# Patient Record
Sex: Male | Born: 1955 | Race: White | Hispanic: No | Marital: Married
Health system: Southern US, Community
[De-identification: ages and names within clinical notes are randomized; demographics above are authoritative.]

---

## 2005-07-23 ENCOUNTER — Inpatient Hospital Stay: Payer: Self-pay | Admitting: Internal Medicine

## 2012-12-15 ENCOUNTER — Ambulatory Visit: Payer: Self-pay | Admitting: Orthopedic Surgery

## 2014-07-03 ENCOUNTER — Emergency Department: Payer: Self-pay | Admitting: Student

## 2015-12-26 IMAGING — CR DG CHEST 2V
1 series · 2 of 2 positions shown · non-contrast
Comparison: None.

CLINICAL DATA: 58-year-old male with left chest pain and congestion
for 2 days. Initial encounter.

EXAM:
CHEST  2 VIEW

[Series 1: w chest pa · 0.14mm/px · 2 of 2 slices shown]
[im 1/2]
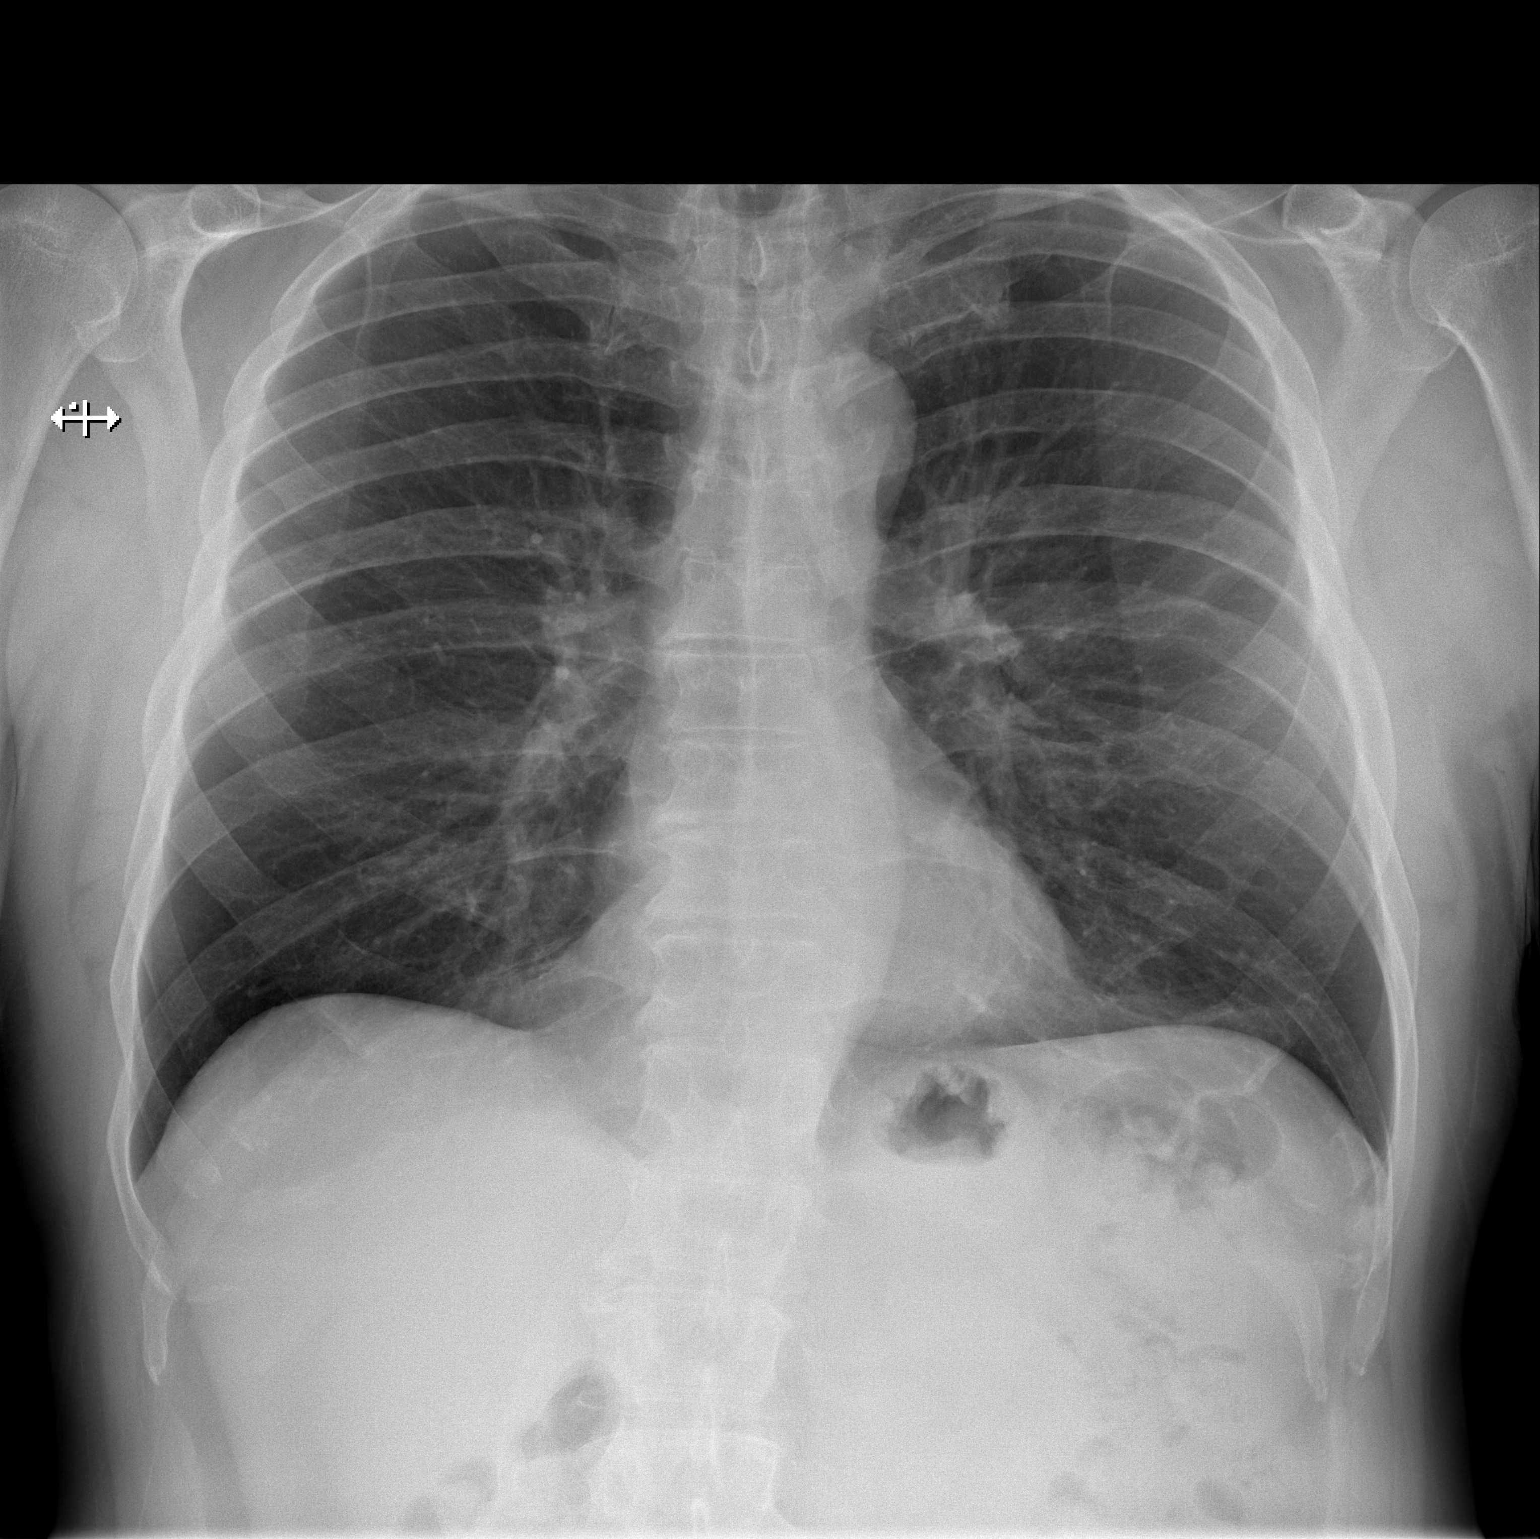
[im 2/2]
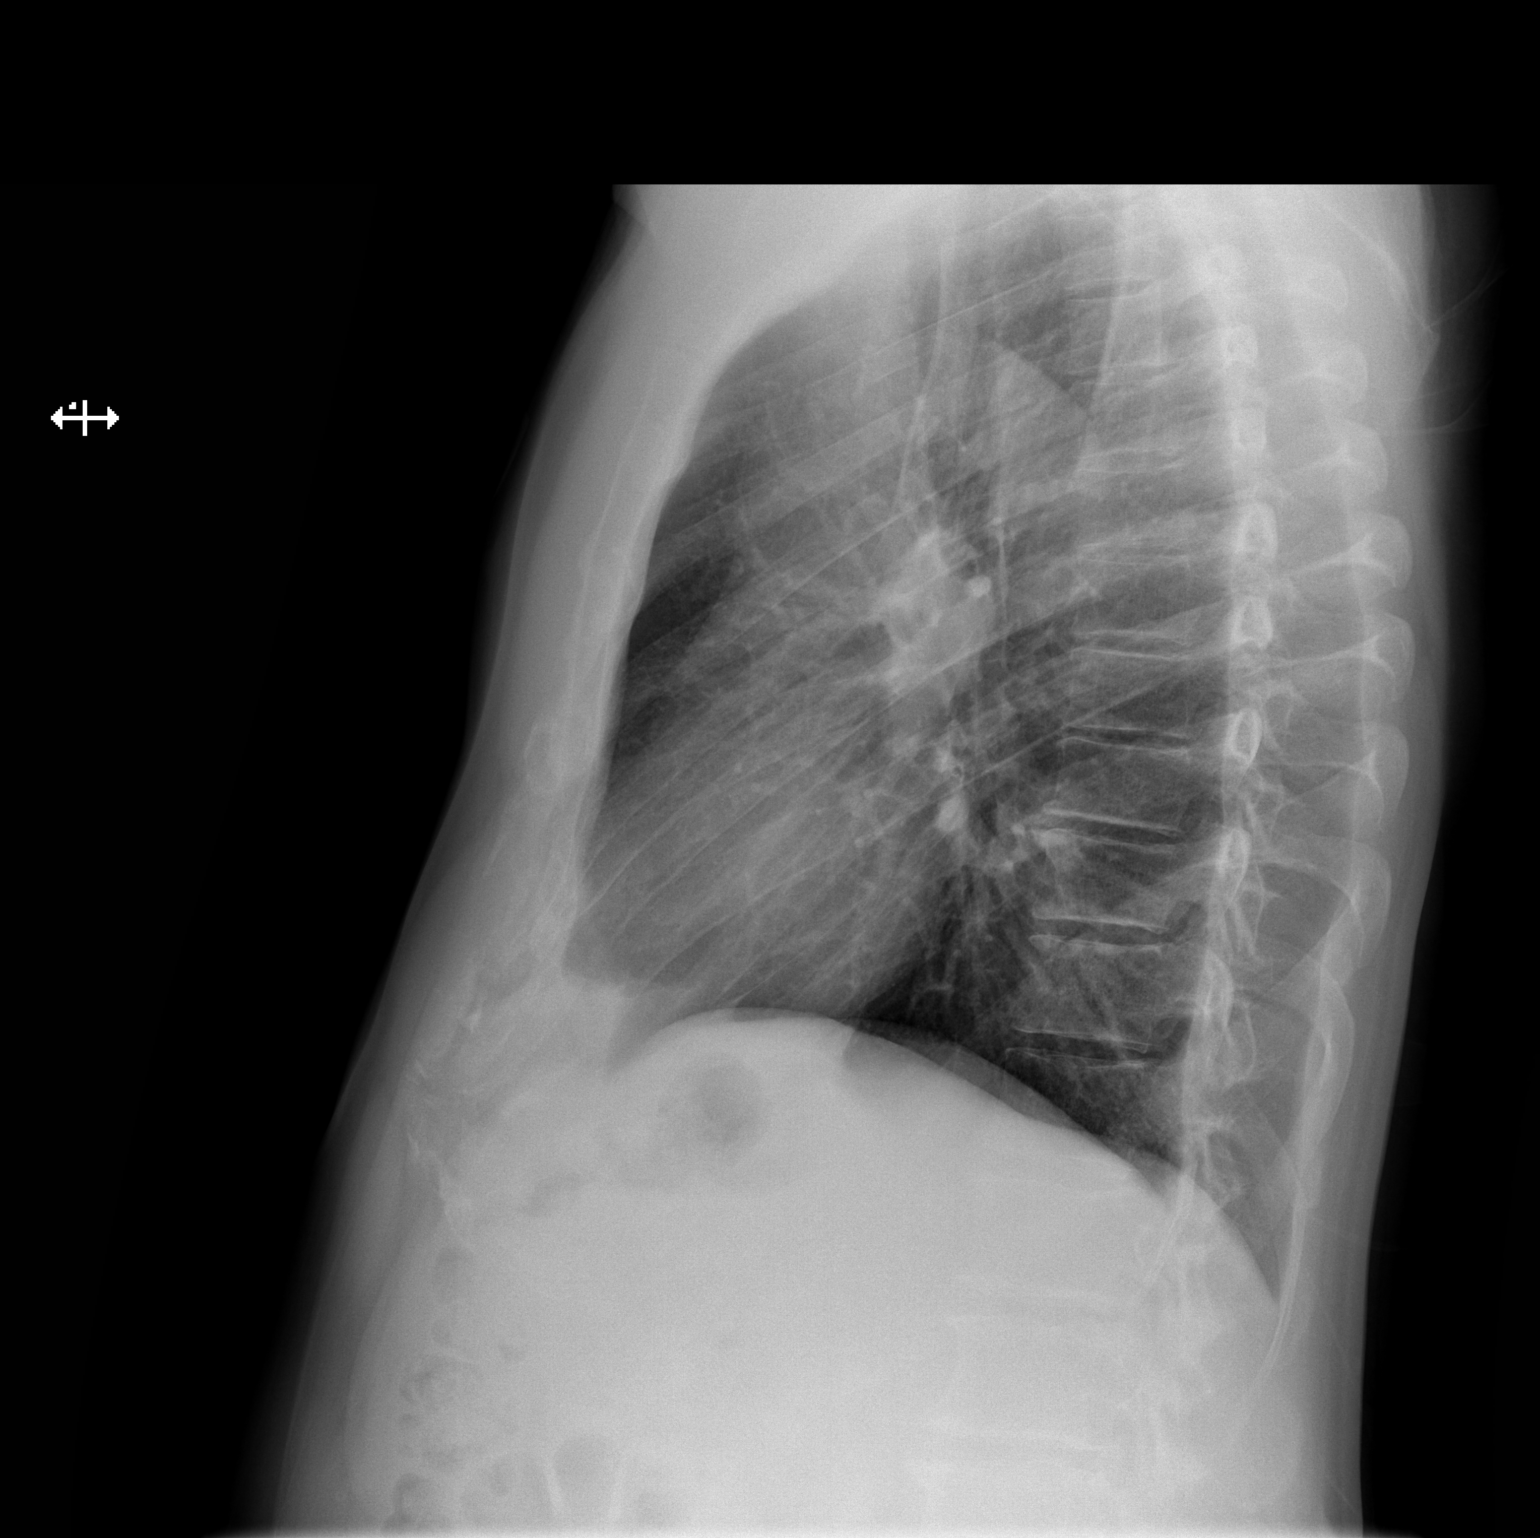

[2 of 2 positions shown; findings below may reference images not displayed]

FINDINGS: Lung volumes at the upper limits of normal. Normal cardiac size and
mediastinal contours. Visualized tracheal air column is within
normal limits. No pneumothorax, pulmonary edema, pleural effusion or
confluent pulmonary opacity. No acute osseous abnormality
identified.
IMPRESSION: Negative, no acute cardiopulmonary abnormality.

## 2019-12-07 ENCOUNTER — Ambulatory Visit: Payer: BC Managed Care – PPO | Attending: Internal Medicine

## 2019-12-07 DIAGNOSIS — Z23 Encounter for immunization: Secondary | ICD-10-CM

## 2019-12-07 NOTE — Progress Notes (Signed)
   Covid-19 Vaccination Clinic  Name:  Alex Morgan    MRN: [id number] DOB: 1956/04/08  12/07/2019  Mr. Alex Morgan was observed post Covid-19 immunization for 15 minutes without incident. He was provided with Vaccine Information Sheet and instruction to access the V-Safe system.   Mr. Alex Morgan was instructed to call 911 with any severe reactions post vaccine: Marland Kitchen Difficulty breathing  . Swelling of face and throat  . A fast heartbeat  . A bad rash all over body  . Dizziness and weakness   Immunizations Administered    Name Date Dose VIS Date Route   Pfizer COVID-19 Vaccine 12/07/2019  9:52 AM 0.3 mL 07/04/2018 Intramuscular   Manufacturer: [employer], [employer]   Lot: [id number]   NDC: [address]-0

## 2019-12-31 ENCOUNTER — Ambulatory Visit: Payer: BC Managed Care – PPO | Attending: Internal Medicine

## 2019-12-31 ENCOUNTER — Ambulatory Visit: Payer: BC Managed Care – PPO

## 2019-12-31 DIAGNOSIS — Z23 Encounter for immunization: Secondary | ICD-10-CM

## 2019-12-31 NOTE — Progress Notes (Signed)
   Covid-19 Vaccination Clinic  Name:  Alex Morgan    MRN: [id number] DOB: 1955-10-08  12/31/2019  Mr. Alex Morgan was observed post Covid-19 immunization for 15 minutes without incident. He was provided with Vaccine Information Sheet and instruction to access the V-Safe system.   Mr. Alex Morgan was instructed to call 911 with any severe reactions post vaccine: Marland Kitchen Difficulty breathing  . Swelling of face and throat  . A fast heartbeat  . A bad rash all over body  . Dizziness and weakness   Immunizations Administered    Name Date Dose VIS Date Route   Pfizer COVID-19 Vaccine 12/31/2019  2:30 PM 0.3 mL 07/04/2018 Intramuscular   Manufacturer: [employer], [employer]   Lot: [id number]   NDC: [address]-9

## 2021-04-23 ENCOUNTER — Ambulatory Visit (INDEPENDENT_AMBULATORY_CARE_PROVIDER_SITE_OTHER): Payer: Medicare Other | Admitting: Dermatology

## 2021-04-23 ENCOUNTER — Other Ambulatory Visit: Payer: Self-pay

## 2021-04-23 DIAGNOSIS — L720 Epidermal cyst: Secondary | ICD-10-CM | POA: Diagnosis not present

## 2021-04-23 DIAGNOSIS — L723 Sebaceous cyst: Secondary | ICD-10-CM

## 2021-04-23 MED ORDER — DOXYCYCLINE MONOHYDRATE 100 MG PO TABS: 100.0000 mg | ORAL_TABLET | Freq: Two times a day (BID) | ORAL | 0 refills | Status: AC

## 2021-04-23 NOTE — Progress Notes (Signed)
° °  Follow-Up Visit   Subjective  Alex Morgan is a 65 y.o. male who presents for the following: Inflamed cyst (Of the R post neck - painful, tender to the touch, difficult to turn neck. Patient would like to discuss treatment options.).  The following portions of the chart were reviewed this encounter and updated as appropriate:   Allergies   Meds   Problems   Med Hx   Surg Hx   Fam Hx       Review of Systems:  No other skin or systemic complaints except as noted in HPI or Assessment and Plan.  Objective  Well appearing patient in no apparent distress; mood and affect are within normal limits.  A focused examination was performed including the neck. Relevant physical exam findings are noted in the Assessment and Plan.  R post neck 1.2 cm inflamed cystic nodule.     Assessment & Plan  Inflamed epidermoid cyst of skin R post neck  Start Doxycycline 100mg  po BID x 1 week. Doxycycline should be taken with food to prevent nausea. Do not lay down for 30 minutes after taking. Be cautious with sun exposure and use good sun protection while on this medication. Pregnant women should not take this medication.   Incision and Drainage - R post neck Location: R post neck  Informed Consent: Discussed risks (permanent scarring, light or dark discoloration, infection, pain, bleeding, bruising, redness, damage to adjacent structures, and recurrence of the lesion) and benefits of the procedure, as well as the alternatives.  Informed consent was obtained.  Preparation: The area was prepped with alcohol.  Anesthesia: Lidocaine 1% with epinephrine  Procedure Details: An incision was made overlying the lesion. The lesion drained pus and blood. .  A small amount of fluid was drained.  Curettage performed. Saline flushed into incision site. Antibiotic ointment and a sterile pressure dressing were applied. The patient tolerated procedure well.  Total number of lesions drained: 1  Plan: The patient  was instructed on post-op care. Recommend OTC analgesia as needed for pain.   doxycycline (ADOXA) 100 MG tablet - R post neck Take 1 tablet (100 mg total) by mouth 2 (two) times daily. Take with food and plenty of drink.  Anaerobic and Aerobic Culture - R post neck   Return if symptoms worsen or fail to improve.  , CMA, am acting as scribe for Maylene Roes, MD .  Documentation: I have reviewed the above documentation for accuracy and completeness, and I agree with the above.  Darden Dates, MD

## 2021-04-23 NOTE — Patient Instructions (Addendum)
Doxycycline should be taken with food to prevent nausea. Do not lay down for 30 minutes after taking. Be cautious with sun exposure and use good sun protection while on this medication. Pregnant women should not take this medication.      If You Need Anything After Your Visit  If you have any questions or concerns for your doctor, please call our main line at [phone] and press option 4 to reach your doctor's medical assistant. If no one answers, please leave a voicemail as directed and we will return your call as soon as possible. Messages left after 4 pm will be answered the following business day.   You may also send us a message via MyChart. We typically respond to MyChart messages within 1-2 business days.  For prescription refills, please ask your pharmacy to contact our office. Our fax number is [phone].  If you have an urgent issue when the clinic is closed that cannot wait until the next business day, you can page your doctor at the number below.    Please note that while we do our best to be available for urgent issues outside of office hours, we are not available 24/7.   If you have an urgent issue and are unable to reach us, you may choose to seek medical care at your doctor's office, retail clinic, urgent care center, or emergency room.  If you have a medical emergency, please immediately call 911 or go to the emergency department.  Pager Numbers  - Dr. Kowalski: [phone]  - Dr. Moye: [phone]  - Dr. Stewart: [phone]  In the event of inclement weather, please call our main line at [phone] for an update on the status of any delays or closures.  Dermatology Medication Tips: Please keep the boxes that topical medications come in in order to help keep track of the instructions about where and how to use these. Pharmacies typically print the medication instructions only on the boxes and not directly on the medication tubes.   If your medication is  too expensive, please contact our office at [phone] option 4 or send us a message through MyChart.   We are unable to tell what your co-pay for medications will be in advance as this is different depending on your insurance coverage. However, we may be able to find a substitute medication at lower cost or fill out paperwork to get insurance to cover a needed medication.   If a prior authorization is required to get your medication covered by your insurance company, please allow us 1-2 business days to complete this process.  Drug prices often vary depending on where the prescription is filled and some pharmacies may offer cheaper prices.  The website www.goodrx.com contains coupons for medications through different pharmacies. The prices here do not account for what the cost may be with help from insurance (it may be cheaper with your insurance), but the website can give you the price if you did not use any insurance.  - You can print the associated coupon and take it with your prescription to the pharmacy.  - You may also stop by our office during regular business hours and pick up a GoodRx coupon card.  - If you need your prescription sent electronically to a different pharmacy, notify our office through [facility] MyChart or by phone at [phone] option 4.     Si Usted Necesita Algo Despus de Su Visita  Tambin puede enviarnos un mensaje a travs de MyChart. Por lo   general respondemos a los mensajes de MyChart en el transcurso de 1 a 2 das hbiles.  Para renovar recetas, por favor pida a su farmacia que se ponga en contacto con nuestra oficina. Nuestro nmero de fax es el [phone].  Si tiene un asunto urgente cuando la clnica est cerrada y que no puede esperar hasta el siguiente da hbil, puede llamar/localizar a su doctor(a) al nmero que aparece a continuacin.   Por favor, tenga en cuenta que aunque hacemos todo lo posible para estar disponibles para asuntos urgentes  fuera del horario de oficina, no estamos disponibles las 24 horas del da, los 7 das de la semana.   Si tiene un problema urgente y no puede comunicarse con nosotros, puede optar por buscar atencin mdica  en el consultorio de su doctor(a), en una clnica privada, en un centro de atencin urgente o en una sala de emergencias.  Si tiene una emergencia mdica, por favor llame inmediatamente al 911 o vaya a la sala de emergencias.  Nmeros de bper  - Dr. Kowalski: [phone]  - Dra. Moye: [phone]  - Dra. Stewart: [phone]  En caso de inclemencias del tiempo, por favor llame a nuestra lnea principal al [phone] para una actualizacin sobre el estado de cualquier retraso o cierre.  Consejos para la medicacin en dermatologa: Por favor, guarde las cajas en las que vienen los medicamentos de uso tpico para ayudarle a seguir las instrucciones sobre dnde y cmo usarlos. Las farmacias generalmente imprimen las instrucciones del medicamento slo en las cajas y no directamente en los tubos del medicamento.   Si su medicamento es muy caro, por favor, pngase en contacto con nuestra oficina llamando al [phone] y presione la opcin 4 o envenos un mensaje a travs de MyChart.   No podemos decirle cul ser su copago por los medicamentos por adelantado ya que esto es diferente dependiendo de la cobertura de su seguro. Sin embargo, es posible que podamos encontrar un medicamento sustituto a menor costo o llenar un formulario para que el seguro cubra el medicamento que se considera necesario.   Si se requiere una autorizacin previa para que su compaa de seguros cubra su medicamento, por favor permtanos de 1 a 2 das hbiles para completar este proceso.  Los precios de los medicamentos varan con frecuencia dependiendo del lugar de dnde se surte la receta y alguna farmacias pueden ofrecer precios ms baratos.  El sitio web www.goodrx.com tiene cupones para medicamentos de  diferentes farmacias. Los precios aqu no tienen en cuenta lo que podra costar con la ayuda del seguro (puede ser ms barato con su seguro), pero el sitio web puede darle el precio si no utiliz ningn seguro.  - Puede imprimir el cupn correspondiente y llevarlo con su receta a la farmacia.  - Tambin puede pasar por nuestra oficina durante el horario de atencin regular y recoger una tarjeta de cupones de GoodRx.  - Si necesita que su receta se enve electrnicamente a una farmacia diferente, informe a nuestra oficina a travs de MyChart de [facility] o por telfono llamando al [phone] y presione la opcin 4.  

## 2021-04-28 ENCOUNTER — Other Ambulatory Visit: Payer: Self-pay

## 2021-04-28 ENCOUNTER — Encounter: Payer: Self-pay | Admitting: Dermatology

## 2021-04-28 ENCOUNTER — Ambulatory Visit (INDEPENDENT_AMBULATORY_CARE_PROVIDER_SITE_OTHER): Payer: Medicare Other | Admitting: Dermatology

## 2021-04-28 DIAGNOSIS — L0211 Cutaneous abscess of neck: Secondary | ICD-10-CM | POA: Diagnosis not present

## 2021-04-28 DIAGNOSIS — L0291 Cutaneous abscess, unspecified: Secondary | ICD-10-CM

## 2021-04-28 MED ORDER — DOXYCYCLINE MONOHYDRATE 100 MG PO CAPS: 100.0000 mg | ORAL_CAPSULE | Freq: Two times a day (BID) | ORAL | 0 refills | Status: AC

## 2021-04-28 NOTE — Patient Instructions (Addendum)

## 2021-04-28 NOTE — Progress Notes (Signed)
° °  Follow-Up Visit   Subjective  Alex Morgan is a 65 y.o. male who presents for the following: Inflamed cyst (R lat neck, 4 days, hx of I&D to an abscess beside this one on 04/23/21, pt currently on Doxycycline 100mg  1 po bid since 04/23/21).  The following portions of the chart were reviewed this encounter and updated as appropriate:   Allergies   Meds   Problems   Med Hx   Surg Hx   Fam Hx      Review of Systems:  No other skin or systemic complaints except as noted in HPI or Assessment and Plan.  Objective  Well appearing patient in no apparent distress; mood and affect are within normal limits.  A focused examination was performed including neck. Relevant physical exam findings are noted in the Assessment and Plan.  R lat neck Erythematous indurated tender nodule 6.0 x 3.0cm   Assessment & Plan  Abscess R lat neck  I&D today Cont Doxycycline 100mg  1 po bid with food and drink for 2 more weeks  Discussed excising once abscess resolves  Doxycycline should be taken with food to prevent nausea. Do not lay down for 30 minutes after taking. Be cautious with sun exposure and use good sun protection while on this medication. Pregnant women should not take this medication.    Incision and Drainage - R lat neck - complicated Location: R lat neck, R post neck  Informed Consent: Discussed risks (permanent scarring, light or dark discoloration, infection, pain, bleeding, bruising, redness, damage to adjacent structures, and recurrence of the lesion) and benefits of the procedure, as well as the alternatives.  Informed consent was obtained.  Preparation: The area was prepped with alcohol.  Anesthesia: Lidocaine 1% with epinephrine  Procedure Details: A 4.9mm punch was made overlying the lesion. The lesion drained pus and blood. Curettage performed. A large amount of fluid was drained.   The lesion was multiloculated.   Multiple cavities were opened and drained.    Antibiotic  ointment and a sterile pressure dressing were applied. The patient tolerated procedure well.  Total number of lesions drained: 2  Plan: The patient was instructed on post-op care. Recommend OTC analgesia as needed for pain.  Cont Doxycycline 100mg  1 po bid for 2 additional weeks, (total of 3 weeks).  doxycycline (MONODOX) 100 MG capsule - R lat neck Take 1 capsule (100 mg total) by mouth 2 (two) times daily for 14 days. Take with food and drink  Return in about 1 month (around 05/29/2021) for f/u cyst x 2 R post neck and R lat neck.  I, 1m, RMA, am acting as scribe for , MD . Documentation: I have reviewed the above documentation for accuracy and completeness, and I agree with the above.  05/31/2021, MD

## 2021-05-04 LAB — ANAEROBIC AND AEROBIC CULTURE

## 2021-05-06 ENCOUNTER — Ambulatory Visit: Payer: Medicare Other

## 2021-05-07 ENCOUNTER — Telehealth: Payer: Self-pay

## 2021-05-07 NOTE — Telephone Encounter (Signed)
-----   Message from Sandi Mealy, MD sent at 05/06/2021  5:49 PM EST ----- Culture grew proteus mirabilis bacteria resistant to tetracycline.  If he is doing well, no additional antibiotic therapy needed. If still sore or having problems, let me know and we will change to cephalexin 500 mg 3 times per day for 5 days. Thank you!

## 2021-05-07 NOTE — Telephone Encounter (Signed)
Patient advised of culture results. Patient has had no problems and found much relief after Dr. Gwen Pounds seen patient for I&D. aw

## 2021-05-09 ENCOUNTER — Encounter: Payer: Self-pay | Admitting: Dermatology

## 2021-05-13 ENCOUNTER — Ambulatory Visit: Payer: BC Managed Care – PPO | Admitting: Dermatology

## 2021-06-03 ENCOUNTER — Encounter: Payer: Self-pay | Admitting: Dermatology

## 2021-06-03 ENCOUNTER — Other Ambulatory Visit: Payer: Self-pay

## 2021-06-03 ENCOUNTER — Ambulatory Visit (INDEPENDENT_AMBULATORY_CARE_PROVIDER_SITE_OTHER): Payer: Medicare Other | Admitting: Dermatology

## 2021-06-03 DIAGNOSIS — L720 Epidermal cyst: Secondary | ICD-10-CM | POA: Diagnosis not present

## 2021-06-03 DIAGNOSIS — L578 Other skin changes due to chronic exposure to nonionizing radiation: Secondary | ICD-10-CM

## 2021-06-03 NOTE — Patient Instructions (Addendum)
The following are pre-op instructions if you decide to have cyst removed by surgical excision:   Pre-Operative Instructions  You are scheduled for a surgical procedure at [facility]. We recommend you read the following instructions. If you have any questions or concerns, please call the office at [phone].  Shower and wash the entire body with soap and water the day of your surgery paying special attention to cleansing at and around the planned surgery site.  Avoid aspirin or aspirin containing products at least fourteen (14) days prior to your surgical procedure and for at least one week (7 Days) after your surgical procedure. If you take aspirin on a regular basis for heart disease or history of stroke or for any other reason, we may recommend you continue taking aspirin but please notify us if you take this on a regular basis. Aspirin can cause more bleeding to occur during surgery as well as prolonged bleeding and bruising after surgery.   Avoid other nonsteroidal pain medications at least one week prior to surgery and at least one week prior to your surgery. These include medications such as Ibuprofen (Motrin, Advil and Nuprin), Naprosyn, Voltaren, Relafen, etc. If medications are used for therapeutic reasons, please inform us as they can cause increased bleeding or prolonged bleeding during and bruising after surgical procedures.   Please advise [place] if you are taking any "blood thinner" medications such as Coumadin or Dipyridamole or Plavix or similar medications. These cause increased bleeding and prolonged bleeding during procedures and bruising after surgical procedures. We may have to consider discontinuing these medications briefly prior to and shortly after your surgery if safe to do so.   Please inform us of all medications you are currently taking. All medications that are taken regularly should be taken the day of surgery as you always do. Nevertheless, we need to be  informed of what medications you are taking prior to surgery to know whether they will affect the procedure or cause any complications.   Please inform us of any medication allergies. Also inform us of whether you have allergies to Latex or rubber products or whether you have had any adverse reaction to Lidocaine or Epinephrine.  Please inform us of any prosthetic or artificial body parts such as artificial heart valve, joint replacements, etc., or similar condition that might require preoperative antibiotics.   We recommend avoidance of alcohol at least two weeks prior to surgery and continued avoidance for at least two weeks after surgery.   We recommend discontinuation of tobacco smoking at least two weeks prior to surgery and continued abstinence for at least two weeks after surgery.  Do not plan strenuous exercise, strenuous work or strenuous lifting for approximately four weeks after your surgery.   We request if you are unable to make your scheduled surgical appointment, please call us at least a week in advance or as soon as you are aware of a problem so that we can cancel or reschedule the appointment.   You MAY TAKE TYLENOL (acetaminophen) for pain as it is not a blood thinner.   PLEASE PLAN TO BE IN TOWN FOR TWO WEEKS FOLLOWING SURGERY, THIS IS IMPORTANT SO YOU CAN BE CHECKED FOR DRESSING CHANGES, SUTURE REMOVAL AND TO MONITOR FOR POSSIBLE COMPLICATIONS.  If You Need Anything After Your Visit  If you have any questions or concerns for your doctor, please call our main line at [phone] and press option 4 to reach your doctor's medical assistant. If no one answers,  please leave a voicemail as directed and we will return your call as soon as possible. Messages left after 4 pm will be answered the following business day.   You may also send us a message via MyChart. We typically respond to MyChart messages within 1-2 business days.  For prescription refills, please ask your pharmacy  to contact our office. Our fax number is [phone].  If you have an urgent issue when the clinic is closed that cannot wait until the next business day, you can page your doctor at the number below.    Please note that while we do our best to be available for urgent issues outside of office hours, we are not available 24/7.   If you have an urgent issue and are unable to reach us, you may choose to seek medical care at your doctor's office, retail clinic, urgent care center, or emergency room.  If you have a medical emergency, please immediately call 911 or go to the emergency department.  Pager Numbers  - Dr. Gwen PoundsKowalski: [phone]  - Dr. Neale BurlyMoye: [phone]  - Dr. Roseanne RenoStewart: [phone]  In the event of inclement weather, please call our main line at [phone] for an update on the status of any delays or closures.  Dermatology Medication Tips: Please keep the boxes that topical medications come in in order to help keep track of the instructions about where and how to use these. Pharmacies typically print the medication instructions only on the boxes and not directly on the medication tubes.   If your medication is too expensive, please contact our office at [phone] option 4 or send us a message through MyChart.   We are unable to tell what your co-pay for medications will be in advance as this is different depending on your insurance coverage. However, we may be able to find a substitute medication at lower cost or fill out paperwork to get insurance to cover a needed medication.   If a prior authorization is required to get your medication covered by your insurance company, please allow us 1-2 business days to complete this process.  Drug prices often vary depending on where the prescription is filled and some pharmacies may offer cheaper prices.  The website www.goodrx.com contains coupons for medications through different pharmacies. The prices here do not account for  what the cost may be with help from insurance (it may be cheaper with your insurance), but the website can give you the price if you did not use any insurance.  - You can print the associated coupon and take it with your prescription to the pharmacy.  - You may also stop by our office during regular business hours and pick up a GoodRx coupon card.  - If you need your prescription sent electronically to a different pharmacy, notify our office through [facility] MyChart or by phone at [phone] option 4.     Si Usted Necesita Algo Despus de Su Visita  Tambin puede enviarnos un mensaje a travs de Clinical cytogeneticistMyChart. Por lo general respondemos a los mensajes de MyChart en el transcurso de 1 a 2 das hbiles.  Para renovar recetas, por favor pida a su farmacia que se ponga en contacto con nuestra oficina. Annie SableNuestro nmero de fax es [place] [phone].  Si tiene un asunto urgente cuando la clnica est cerrada y que no puede esperar hasta el siguiente da hbil, puede llamar/localizar a su doctor(a) al nmero que aparece a continuacin.   Por favor, tenga en cuenta que aunque  hacemos todo lo posible para estar disponibles para asuntos urgentes fuera del horario de oficina, no estamos disponibles las 24 horas del da, los 7 [address]  [address] de la [place].   Si tiene un problema urgente y no puede comunicarse con nosotros, puede optar por buscar atencin mdica  en el consultorio de su doctor(a), en una clnica privada, en un centro de atencin urgente o en una sala de emergencias.  Si tiene Engineer, drilling, por favor llame inmediatamente al 911 o vaya a la sala de emergencias.  Nmeros de bper  - Dr. Gwen Pounds: [phone]  - Dra. Moye: [phone]  - Dra. Roseanne Reno: [phone]  En caso de inclemencias del [place], por favor llame a Lacy Duverney principal al [phone] para una actualizacin sobre el [place] de cualquier retraso o cierre.  Consejos para la medicacin en dermatologa: Por favor, guarde  las cajas en las que vienen los medicamentos de uso tpico para ayudarle a seguir las instrucciones sobre dnde y cmo usarlos. Las farmacias generalmente imprimen las instrucciones del medicamento slo en las cajas y no directamente en los tubos del [place].   Si su medicamento es muy caro, por favor, pngase en contacto con Rolm Gala llamando al [phone] y presione la opcin 4 o envenos un mensaje a travs de Clinical cytogeneticist.   No podemos decirle cul ser su copago por los medicamentos por adelantado ya que esto es diferente dependiendo de la cobertura de su seguro. Sin embargo, es posible que podamos encontrar un medicamento sustituto a Audiological scientist un formulario para que el seguro cubra el medicamento que se considera necesario.   Si se requiere una autorizacin previa para que su compaa de seguros [place] su medicamento, por favor permtanos de 1 a 2 das hbiles para completar [address]  Los precios de los medicamentos varan con frecuencia dependiendo del Environmental consultant de dnde se surte la receta y alguna farmacias pueden ofrecer precios ms baratos.  El sitio web www.goodrx.com tiene cupones para medicamentos de Health and safety inspector. Los precios aqu no tienen en cuenta lo que podra costar con la ayuda del seguro (puede ser ms barato con su seguro), pero el sitio web puede darle el precio si no utiliz Tourist information centre manager.  - Puede imprimir el cupn correspondiente y llevarlo con su receta a la farmacia.  - Tambin puede pasar por nuestra oficina durante el horario de atencin regular y Education officer, museum una tarjeta de cupones de GoodRx.  - Si necesita que su receta se enve electrnicamente a una farmacia diferente, informe a nuestra oficina a travs de MyChart de [place] o por telfono llamando al [phone] y presione la opcin 4.

## 2021-06-03 NOTE — Progress Notes (Signed)
° °  Follow-Up Visit   Subjective  Alex Morgan is a 66 y.o. male who presents for the following: Cyst (1 month recheck. Hx of I&D for inflamed cyst x2. Took Doxycycline as directed. Here to discuss surgical removal. ).  It is much smaller and not bothersome at this time.    The following portions of the chart were reviewed this encounter and updated as appropriate:      Review of Systems: No other skin or systemic complaints except as noted in HPI or Assessment and Plan.   Objective  Well appearing patient in no apparent distress; mood and affect are within normal limits.  A focused examination was performed including head, including the scalp, face, neck, nose, ears, eyelids, and lips. Relevant physical exam findings are noted in the Assessment and Plan.  Right Posterior Neck Slightly indurated indistinct subcutaneous nodule at right posterior neck at hair line ~2x1cm.  Slightly firm depressed scar anterior to nodule.   Assessment & Plan  Epidermal inclusion cyst Right Posterior Neck  VS Scar Tissue from recent I&D  Benign-appearing. Exam most consistent with residual epidermal inclusion cyst vrs scar. Discussed that a cyst is a benign growth that can grow over time and sometimes get irritated or inflamed. Recommend observation if it is not bothersome. Discussed option of surgical excision to remove it if it is growing, symptomatic, or other changes noted. Please call for new or changing lesions so they can be evaluated.  Patient will observe for now. If gets larger or changes will call for surgical excision.     Actinic Damage - chronic, secondary to cumulative UV radiation exposure/sun exposure over time - diffuse scaly erythematous macules with underlying dyspigmentation scalp/neck/face - Recommend daily broad spectrum sunscreen SPF 30+ to sun-exposed areas, reapply every 2 hours as needed.  - Recommend staying in the shade or wearing long sleeves, sun glasses (UVA+UVB  protection) and wide brim hats (4-inch brim around the entire circumference of the hat). - Call for new or changing lesions.   Return if symptoms worsen or fail to improve.  I, Lawson Radar, CMA, am acting as scribe for Willeen Niece, MD.  Documentation: I have reviewed the above documentation for accuracy and completeness, and I agree with the above.  Willeen Niece MD

## 2022-08-19 ENCOUNTER — Telehealth: Payer: Self-pay

## 2022-08-19 NOTE — Telephone Encounter (Signed)
Patient called asking can he have a RF of his Mupirocin Ointment. He keeps it on hand for when he has a small cut or when needed to keep the risk of infection down.

## 2022-08-23 MED ORDER — MUPIROCIN 2 % EX OINT: 1.0000 | TOPICAL_OINTMENT | Freq: Two times a day (BID) | CUTANEOUS | 0 refills | Status: AC

## 2022-08-23 NOTE — Telephone Encounter (Signed)
Patient advised and RX sent in. aw 

## 2023-06-21 ENCOUNTER — Ambulatory Visit: Payer: Medicare Other | Admitting: Dermatology

## 2023-06-21 DIAGNOSIS — W098XXA Fall on or from other playground equipment, initial encounter: Secondary | ICD-10-CM | POA: Diagnosis not present

## 2023-06-21 DIAGNOSIS — D1801 Hemangioma of skin and subcutaneous tissue: Secondary | ICD-10-CM

## 2023-06-21 DIAGNOSIS — H0012 Chalazion right lower eyelid: Secondary | ICD-10-CM

## 2023-06-21 DIAGNOSIS — L814 Other melanin hyperpigmentation: Secondary | ICD-10-CM

## 2023-06-21 DIAGNOSIS — D229 Melanocytic nevi, unspecified: Secondary | ICD-10-CM

## 2023-06-21 DIAGNOSIS — L729 Follicular cyst of the skin and subcutaneous tissue, unspecified: Secondary | ICD-10-CM

## 2023-06-21 DIAGNOSIS — L720 Epidermal cyst: Secondary | ICD-10-CM

## 2023-06-21 DIAGNOSIS — Z1283 Encounter for screening for malignant neoplasm of skin: Secondary | ICD-10-CM | POA: Diagnosis not present

## 2023-06-21 DIAGNOSIS — L219 Seborrheic dermatitis, unspecified: Secondary | ICD-10-CM

## 2023-06-21 DIAGNOSIS — L578 Other skin changes due to chronic exposure to nonionizing radiation: Secondary | ICD-10-CM | POA: Diagnosis not present

## 2023-06-21 DIAGNOSIS — I781 Nevus, non-neoplastic: Secondary | ICD-10-CM

## 2023-06-21 DIAGNOSIS — L821 Other seborrheic keratosis: Secondary | ICD-10-CM

## 2023-06-21 MED ORDER — FLUOCINOLONE ACETONIDE 0.01 % OT OIL: 1.0000 | TOPICAL_OIL | OTIC | 11 refills | Status: AC

## 2023-06-21 NOTE — Patient Instructions (Addendum)
with your prescription to the pharmacy.  - You may also stop by our office during regular business hours and pick up a GoodRx coupon card.  - If you need your prescription sent electronically to a different pharmacy, notify our office through [facility] or by phone at [phone] option 4.     Si Usted Necesita Algo Despus de Su Visita  Tambin puede enviarnos un mensaje a travs de Clinical cytogeneticist. Por lo general respondemos a los mensajes de MyChart en el transcurso de 1 a 2 das  hbiles.  Para renovar recetas, por favor pida a su farmacia que se ponga en contacto con nuestra oficina. Annie Sable de fax es [place] [phone].  Si tiene un asunto urgente cuando la clnica est cerrada y que no puede esperar hasta el siguiente da hbil, puede llamar/localizar a su doctor(a) al nmero que aparece a continuacin.   Por favor, tenga en cuenta que aunque hacemos todo lo posible para estar disponibles para asuntos urgentes fuera del horario de [place], no estamos disponibles las 24 horas del da, los 7 [address]  [address] de la [place].   Si tiene un problema urgente y no puede comunicarse con nosotros, puede optar por buscar atencin mdica  en el consultorio de su doctor(a), en una clnica privada, en un centro de atencin urgente o en una sala de emergencias.  Si tiene Engineer, drilling, por favor llame inmediatamente al 911 o vaya a la sala de emergencias.  Nmeros de bper  - Dr. Gwen Pounds: [phone]  - Dra. Roseanne Reno: [phone]  - Dr. Katrinka Blazing: [phone]   En caso de inclemencias del tiempo, por favor llame a Lacy Duverney principal al [phone] para una actualizacin sobre el [place] de cualquier retraso o cierre.  Consejos para la medicacin en dermatologa: Por favor, guarde las cajas en las que vienen los medicamentos de uso tpico para ayudarle a seguir las instrucciones sobre dnde y cmo usarlos. Las farmacias generalmente imprimen las instrucciones del medicamento slo en las cajas y no directamente en los tubos del [place].   Si su medicamento es muy caro, por favor, pngase en contacto con Rolm Gala llamando al [phone] y presione la opcin 4 o envenos un mensaje a travs de Clinical cytogeneticist.   No podemos decirle cul ser su copago por los medicamentos por adelantado ya que esto es diferente dependiendo de la cobertura de su seguro. Sin embargo, es posible que podamos encontrar un medicamento sustituto a Audiological scientist un formulario para que el  seguro cubra el medicamento que se considera necesario.   Si se requiere una autorizacin previa para que su compaa de seguros [place] su medicamento, por favor permtanos de 1 a 2 das hbiles para completar [address]  Los precios de los medicamentos varan con frecuencia dependiendo del Environmental consultant de dnde se surte la receta y alguna farmacias pueden ofrecer precios ms baratos.  El sitio web www.goodrx.com tiene cupones para medicamentos de Health and safety inspector. Los precios aqu no tienen en cuenta lo que podra costar con la ayuda del seguro (puede ser ms barato con su seguro), pero el sitio web puede darle el precio si no utiliz Tourist information centre manager.  - Puede imprimir el cupn correspondiente y llevarlo con su receta a la farmacia.  - Tambin puede pasar por nuestra oficina durante el horario de atencin regular y Education officer, museum una tarjeta de cupones de GoodRx.  - Si necesita que su receta se enve electrnicamente a Psychiatrist, informe a nuestra oficina a travs de MyChart de  [place] o por telfono llamando al [phone] y presione la opcin 4.

## 2023-06-21 NOTE — Progress Notes (Signed)
Follow-Up Visit   Subjective  Alex Morgan is a 68 y.o. male who presents for the following: Skin Cancer Screening and Upper Body Skin Exam  The patient presents for Upper Body Skin Exam (UBSE) for skin cancer screening and mole check. The patient has spots, moles and lesions to be evaluated, some may be new or changing and the patient may have concern these could be cancer.    The following portions of the chart were reviewed this encounter and updated as appropriate: medications, allergies, medical history  Review of Systems:  No other skin or systemic complaints except as noted in HPI or Assessment and Plan.  Objective  Well appearing patient in no apparent distress; mood and affect are within normal limits.  All skin waist up examined. Relevant physical exam findings are noted in the Assessment and Plan.     Assessment & Plan    Skin cancer screening performed today.  Actinic Damage - Chronic condition, secondary to cumulative UV/sun exposure - diffuse scaly erythematous macules with underlying dyspigmentation - Recommend daily broad spectrum sunscreen SPF 30+ to sun-exposed areas, reapply every 2 hours as needed.  - Staying in the shade or wearing long sleeves, sun glasses (UVA+UVB protection) and wide brim hats (4-inch brim around the entire circumference of the hat) are also recommended for sun protection.  - Call for new or changing lesions.  Lentigines, Seborrheic Keratoses, Hemangiomas - Benign normal skin lesions - Benign-appearing - Call for any changes - scalp, trunk, arms  Melanocytic Nevi - Tan-brown and/or pink-flesh-colored symmetric macules and papules - Benign appearing on exam today - Observation - Call clinic for new or changing moles - Recommend daily use of broad spectrum spf 30+ sunscreen to sun-exposed areas.   EPIDERMAL INCLUSION CYST R mid cheek Exam: 4.43mm indistinct firm sq nodule R mid cheek  Benign-appearing. Exam most consistent  with an epidermal inclusion cyst. Discussed that a cyst is a benign growth that can grow over time and sometimes get irritated or inflamed. Recommend observation if it is not bothersome. Discussed option of surgical excision to remove it if it is growing, symptomatic, or other changes noted. Please call for new or changing lesions so they can be evaluated.  TELANGIECTASIA Exam: dilated blood vessel(s) above R brow  Treatment Plan: Benign appearing on exam Call for changes  CHALAZION vs OTHER R medial lower eyelid Exam: 3.31mm pink/red thin papule R medial lower eyelid, photo today. pt states has been there for a while and doesn't clear up completely  Treatment Plan: Benign-appearing.  Observation.  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 Condition is symptomatic / bothersome to patient. Not to goal.  Seborrheic Dermatitis is a chronic persistent rash characterized by pinkness and scaling most commonly of the mid face but also can occur on the scalp (dandruff), ears; mid chest, mid back and groin.  It tends to be exacerbated by stress and cooler weather.  People who have neurologic disease may experience new onset or exacerbation of existing seborrheic dermatitis.  The condition is not curable but treatable and can be controlled.  Treatment Plan: Start Dermotic oil 1-2gtts qd/bid prn itchy flares  Topical steroids (such as triamcinolone, fluocinolone, fluocinonide, mometasone, clobetasol, halobetasol, betamethasone, hydrocortisone) can cause thinning and lightening of the skin if they are used for too long in the same area. Your physician has  selected the right strength medicine for your problem and area affected on the body. Please use your medication only as directed by your physician to prevent side effects.       Return in about 1 year (around 06/20/2024) for UBSE.  I, Ardis Rowan, RMA, am acting as scribe for Willeen Niece, MD .   Documentation: I have reviewed the above documentation for accuracy and completeness, and I agree with the above.  Willeen Niece, MD

## 2024-06-26 ENCOUNTER — Ambulatory Visit: Payer: Medicare Other | Admitting: Dermatology
